# Patient Record
Sex: Male | Born: 1990 | Race: White | Hispanic: No | Marital: Single | Smoking: Never smoker
Health system: Southern US, Community
[De-identification: ages and names within clinical notes are randomized; demographics above are authoritative.]

## PROBLEM LIST (undated history)

## (undated) DIAGNOSIS — M419 Scoliosis, unspecified: Secondary | ICD-10-CM

## (undated) DIAGNOSIS — J45909 Unspecified asthma, uncomplicated: Secondary | ICD-10-CM

## (undated) HISTORY — PX: OTHER SURGICAL HISTORY: SHX169

## (undated) HISTORY — PX: ADENOIDECTOMY: SUR15

## (undated) HISTORY — PX: TONSILLECTOMY: SUR1361

---
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
## 2009-10-16 ENCOUNTER — Emergency Department (HOSPITAL_COMMUNITY): Admission: EM | Admit: 2009-10-16 | Discharge: 2009-10-16 | Payer: Self-pay | Admitting: Family Medicine

## 2010-10-10 NOTE — Op Note (Signed)
Trevor Miller, Trevor Miller                 ACCOUNT NO.:  [id number]   MEDICAL RECORD NO.:  [id number]          PATIENT TYPE:  AMB   LOCATION:  DSC                          FACILITY:  [facility]   PHYSICIAN:  Jefry H. Pollyann Kennedy, MD     DATE OF BIRTH:  05-16-1991   DATE OF PROCEDURE:  09/02/2004  DATE OF DISCHARGE:                                 OPERATIVE REPORT   PREOPERATIVE DIAGNOSIS:  Eustachian tube dysfunction.   POSTOPERATIVE DIAGNOSIS:  Eustachian tube dysfunction.   PROCEDURE:  Bilateral myringotomy with T-tube insertion.   SURGEON:  Jefry H. Pollyann Kennedy, MD.   ANESTHESIA:  Mask inhalation anesthesia was used.   COMPLICATIONS.:  None.   FINDINGS:  Severe bilateral tympanic membrane retraction with atelectasis  and adhesion to the promontory. There was thick mucoid middle ear effusion  on the right side.  Left side was without.   REFERRING PHYSICIAN:  Dr. Lysbeth Galas.   HISTORY:  20 year old with a lifelong history eustachian tube dysfunction,  status post multiple sets of ventilation tubes. Risks, benefits,  alternatives, complications of procedure were explained to the mother, who  seemed to understand and agreed to surgery.   PROCEDURE:  The patient was taken to the operating room, placed on the  operating table in supine position. Following induction of mask inhalation  anesthesia with intravenous sedation, the ears were examined using operating  microscope. Anterior-inferior myringotomy incisions were created in the  tubotympanic recess which was  really the only aerated and only middle ear space on both sides. Thick  effusion was aspirated from the right side. T tubes were placed without  difficulty. Ciprodex was dropped into the ear canals and cotton balls were  placed bilaterally. The patient was awakened, transferred to recovery in  stable condition.      JHR/MEDQ  D:  09/02/2004  T:  09/02/2004  Job:  [id number]   cc:   Delaney Meigs, M.D.  [address]  [place]  [place]  [address]  Fax: [phone]

## 2012-04-15 ENCOUNTER — Emergency Department (HOSPITAL_COMMUNITY): Payer: Self-pay

## 2012-04-15 ENCOUNTER — Encounter (HOSPITAL_COMMUNITY): Payer: Self-pay | Admitting: *Deleted

## 2012-04-15 ENCOUNTER — Emergency Department (HOSPITAL_COMMUNITY)
Admission: EM | Admit: 2012-04-15 | Discharge: 2012-04-15 | Disposition: A | Discharge status: Home / Self Care | Payer: Self-pay | Attending: Emergency Medicine | Admitting: Emergency Medicine

## 2012-04-15 DIAGNOSIS — R059 Cough, unspecified: Secondary | ICD-10-CM | POA: Insufficient documentation

## 2012-04-15 DIAGNOSIS — B338 Other specified viral diseases: Secondary | ICD-10-CM | POA: Insufficient documentation

## 2012-04-15 DIAGNOSIS — M4120 Other idiopathic scoliosis, site unspecified: Secondary | ICD-10-CM | POA: Insufficient documentation

## 2012-04-15 DIAGNOSIS — J3489 Other specified disorders of nose and nasal sinuses: Secondary | ICD-10-CM | POA: Insufficient documentation

## 2012-04-15 DIAGNOSIS — B349 Viral infection, unspecified: Secondary | ICD-10-CM

## 2012-04-15 DIAGNOSIS — R05 Cough: Secondary | ICD-10-CM | POA: Insufficient documentation

## 2012-04-15 DIAGNOSIS — R197 Diarrhea, unspecified: Secondary | ICD-10-CM | POA: Insufficient documentation

## 2012-04-15 HISTORY — DX: Scoliosis, unspecified: M41.9

## 2012-04-15 LAB — COMPREHENSIVE METABOLIC PANEL
ALT: 16 U/L (ref 0–53)
Alkaline Phosphatase: 71 U/L (ref 39–117)
CO2: 22 mEq/L (ref 19–32)
Chloride: 100 mEq/L (ref 96–112)
GFR calc Af Amer: >90 mL/min (ref >90)
GFR calc non Af Amer: >90 mL/min (ref >90)
Glucose, Bld: 134 mg/dL — ABNORMAL HIGH (ref 70–99)
Potassium: 3.1 mEq/L — ABNORMAL LOW (ref 3.5–5.1)
Sodium: 138 mEq/L (ref 135–145)
Total Bilirubin: 0.4 mg/dL (ref 0.3–1.2)
Total Protein: 8.4 g/dL — ABNORMAL HIGH (ref 6.0–8.3)

## 2012-04-15 LAB — CBC WITH DIFFERENTIAL/PLATELET
Hemoglobin: 17.3 g/dL — ABNORMAL HIGH (ref 13.0–17.0)
Lymphocytes Relative: 25 % (ref 12–46)
Lymphs Abs: 3.5 10*3/uL (ref 0.7–4.0)
Monocytes Relative: 11 % (ref 3–12)
Neutro Abs: 9.2 10*3/uL — ABNORMAL HIGH (ref 1.7–7.7)
Neutrophils Relative %: 65 % (ref 43–77)
Platelets: 280 10*3/uL (ref 150–400)
RBC: 5.44 MIL/uL (ref 4.22–5.81)
WBC: 14.3 10*3/uL — ABNORMAL HIGH (ref 4.0–10.5)

## 2012-04-15 LAB — URINALYSIS, ROUTINE W REFLEX MICROSCOPIC
Bilirubin Urine: NEGATIVE
Ketones, ur: NEGATIVE mg/dL
Nitrite: NEGATIVE
Protein, ur: NEGATIVE mg/dL
pH: 5.5 (ref 5.0–8.0)

## 2012-04-15 LAB — RAPID URINE DRUG SCREEN, HOSP PERFORMED
Barbiturates: NOT DETECTED
Benzodiazepines: POSITIVE — AB

## 2012-04-15 NOTE — ED Notes (Signed)
Pt states he quit cold [place] 3 weeks ago, but heard that was dangerous so he took half a tablet today. States it was oval,blue, and he was unsure of the mg.

## 2012-04-15 NOTE — ED Notes (Signed)
Pt states has been taking xanax for approx 3 years. Pt now states that he stopped taking it 3 weeks ago, COmplains of shakiness and jitteriness.

## 2012-04-15 NOTE — ED Notes (Signed)
Explained to pt we are waiting for Dr Deretha Emory to reassess pt.

## 2012-04-15 NOTE — ED Notes (Signed)
Patient transported to X-ray . Trevor Miller 

## 2012-04-15 NOTE — ED Notes (Signed)
Pt has been taking xanax for 3 years, stopped app 3 weeks ago, feels shakey and disoriented.,numb sensation

## 2012-04-15 NOTE — ED Provider Notes (Signed)
History   This chart was scribed for Trevor Jakes, MD by Trevor Miller, ED Scribe. This patient was seen in room APA14/APA14 and the patient's care was started at 8:33 PM    CSN: [id number]  Arrival date & time 04/15/12  1906   First MD Initiated Contact with Patient 04/15/12 1959      Chief Complaint  Patient presents with  . V70.1     The history is provided by the patient. No language interpreter was used.   Trevor Miller is a 21 y.o. male who presents to the Emergency Department complaining of 2-3 days of rhinorrhea with associated sore throat, phlegm-producing cough, fever, and 12-15 episodes of non-bloody diarrhea.  Pt also reports mild wheezing but states it is normal to asthma.  Pt denies dysuria, rash, HA, chest pain, dyspnea, abdominal pain, nausea, and emesis.  Pt reports sick contact 3 days ago. Pt further reports mild dizziness and "not feeling right" over past 2-3 days that he attributes to withdrawal from Xanax stopped 3 weeks ago.  Pt had been self-medicating for 3 years.   Pt has no h/o chronic medical conditions.  Pt denies tobacco use but reports alcohol use.   Past Medical History  Diagnosis Date  . Scoliosis     Past Surgical History  Procedure Date  . Tubes in ears   . Tonsillectomy   . Adenoidectomy     History reviewed. No pertinent family history.  History  Substance Use Topics  . Smoking status: Never Smoker   . Smokeless tobacco: Not on file  . Alcohol Use: Yes      Review of Systems  Constitutional: Negative for fever.  HENT: Positive for sore throat and rhinorrhea.   Respiratory: Positive for cough. Negative for shortness of breath.   Cardiovascular: Negative for chest pain.  Gastrointestinal: Positive for diarrhea. Negative for nausea, vomiting and abdominal pain.  Genitourinary: Negative for dysuria.  Musculoskeletal: Negative for back pain.  Skin: Negative for rash.  Neurological: Negative for headaches.    Allergies  Sulfa  antibiotics and Motrin  Home Medications  No current outpatient prescriptions on file.  BP 176/109  Pulse 62  Temp 98.2 F (36.8 C) (Oral)  Resp 18  Ht 5\' 7"  (1.702 m)  Wt 245 lb (111.131 kg)  BMI 38.37 kg/m2  SpO2 96%  Physical Exam  Nursing note and vitals reviewed. Constitutional: He is oriented to person, place, and time. He appears well-developed and well-nourished.  HENT:  Head: Normocephalic and atraumatic.  Mouth/Throat: Oropharynx is clear and moist.       Left PE tube in place, right PE tube in ear canal unsure if it is in place.  Bilateral TM normal.  Eyes: Conjunctivae normal and EOM are normal. Pupils are equal, round, and reactive to light.  Neck: Normal range of motion. No tracheal deviation present.  Cardiovascular: Normal rate, regular rhythm and normal heart sounds.   No murmur heard. Pulmonary/Chest: Effort normal and breath sounds normal. He has no wheezes.  Abdominal: Soft. Bowel sounds are normal. There is no tenderness.  Musculoskeletal: Normal range of motion. He exhibits no edema.  Lymphadenopathy:    He has no cervical adenopathy.  Neurological: He is alert and oriented to person, place, and time. No cranial nerve deficit. Coordination normal.  Skin: Skin is warm.  Psychiatric: He has a normal mood and affect.    ED Course  Procedures (including critical care time) DIAGNOSTIC STUDIES: Oxygen Saturation is 96% on room  air, adequate by my interpretation.    COORDINATION OF CARE: 8:43 PM- Patient informed of clinical course, understands medical decision-making process, and agrees with plan.  Ordered CBC, c-met, urinalysis, drug screen panel, chest XR, and head CT w/o contrast.  Labs Reviewed  CBC WITH DIFFERENTIAL - Abnormal; Notable for the following:    WBC 14.3 (*)     Hemoglobin 17.3 (*)     MCHC 36.9 (*)     Neutro Abs 9.2 (*)     Monocytes Absolute 1.5 (*)     All other components within normal limits  COMPREHENSIVE METABOLIC PANEL -  Abnormal; Notable for the following:    Potassium 3.1 (*)     Glucose, Bld 134 (*)     Total Protein 8.4 (*)     All other components within normal limits  URINALYSIS, ROUTINE W REFLEX MICROSCOPIC - Abnormal; Notable for the following:    Specific Gravity, Urine >1.030 (*)     All other components within normal limits  URINE RAPID DRUG SCREEN (HOSP PERFORMED) - Abnormal; Notable for the following:    Opiates POSITIVE (*)     Benzodiazepines POSITIVE (*)     Tetrahydrocannabinol POSITIVE (*)     All other components within normal limits   Results for orders placed during the hospital encounter of 04/15/12  CBC WITH DIFFERENTIAL      Component Value Range   WBC 14.3 (*) 4.0 - 10.5 K/uL   RBC 5.44  4.22 - 5.81 MIL/uL   Hemoglobin 17.3 (*) 13.0 - 17.0 g/dL   HCT 16.1  09.6 - 04.5 %   MCV 86.2  78.0 - 100.0 fL   MCH 31.8  26.0 - 34.0 pg   MCHC 36.9 (*) 30.0 - 36.0 g/dL   RDW 40.9  81.1 - 91.4 %   Platelets 280  150 - 400 K/uL   Neutrophils Relative 65  43 - 77 %   Neutro Abs 9.2 (*) 1.7 - 7.7 K/uL   Lymphocytes Relative 25  12 - 46 %   Lymphs Abs 3.5  0.7 - 4.0 K/uL   Monocytes Relative 11  3 - 12 %   Monocytes Absolute 1.5 (*) 0.1 - 1.0 K/uL   Eosinophils Relative 0  0 - 5 %   Eosinophils Absolute 0.0  0.0 - 0.7 K/uL   Basophils Relative 0  0 - 1 %   Basophils Absolute 0.0  0.0 - 0.1 K/uL  COMPREHENSIVE METABOLIC PANEL      Component Value Range   Sodium 138  135 - 145 mEq/L   Potassium 3.1 (*) 3.5 - 5.1 mEq/L   Chloride 100  96 - 112 mEq/L   CO2 22  19 - 32 mEq/L   Glucose, Bld 134 (*) 70 - 99 mg/dL   BUN 11  6 - 23 mg/dL   Creatinine, Ser 7.82  0.50 - 1.35 mg/dL   Calcium 95.6  8.4 - 21.3 mg/dL   Total Protein 8.4 (*) 6.0 - 8.3 g/dL   Albumin 4.9  3.5 - 5.2 g/dL   AST 15  0 - 37 U/L   ALT 16  0 - 53 U/L   Alkaline Phosphatase 71  39 - 117 U/L   Total Bilirubin 0.4  0.3 - 1.2 mg/dL   GFR calc non Af Amer >90  >90 mL/min   GFR calc Af Amer >90  >90 mL/min    URINALYSIS, ROUTINE W REFLEX MICROSCOPIC      Component Value  Range   Color, Urine YELLOW  YELLOW   APPearance CLEAR  CLEAR   Specific Gravity, Urine >1.030 (*) 1.005 - 1.030   pH 5.5  5.0 - 8.0   Glucose, UA NEGATIVE  NEGATIVE mg/dL   Hgb urine dipstick NEGATIVE  NEGATIVE   Bilirubin Urine NEGATIVE  NEGATIVE   Ketones, ur NEGATIVE  NEGATIVE mg/dL   Protein, ur NEGATIVE  NEGATIVE mg/dL   Urobilinogen, UA 0.2  0.0 - 1.0 mg/dL   Nitrite NEGATIVE  NEGATIVE   Leukocytes, UA NEGATIVE  NEGATIVE  URINE RAPID DRUG SCREEN (HOSP PERFORMED)      Component Value Range   Opiates POSITIVE (*) NONE DETECTED   Cocaine NONE DETECTED  NONE DETECTED   Benzodiazepines POSITIVE (*) NONE DETECTED   Amphetamines NONE DETECTED  NONE DETECTED   Tetrahydrocannabinol POSITIVE (*) NONE DETECTED   Barbiturates NONE DETECTED  NONE DETECTED    Dg Chest 2 View  04/15/2012  *RADIOLOGY REPORT*  Clinical Data: Cough and wheezing for 4 days with history of asthma.  Smoker.  CHEST - 2 VIEW  Comparison: None.  Findings: Midline trachea.  Normal heart size and mediastinal contours. No pleural effusion or pneumothorax.  Clear lungs.  IMPRESSION: Normal chest.   Original Report Authenticated By: Jeronimo Greaves, M.D.    Ct Head Wo Contrast  04/15/2012  *RADIOLOGY REPORT*  Clinical Data: Numbness, disorientation.  CT HEAD WITHOUT CONTRAST  Technique:  Contiguous axial images were obtained from the base of the skull through the vertex without contrast.  Comparison: None.  Findings: Partial opacification of right mastoid air cells. There is no evidence of acute intracranial hemorrhage, brain edema, mass lesion, acute infarction,   mass effect, or midline shift. Acute infarct may be inapparent on noncontrast CT.  No other intra-axial abnormalities are seen, and the ventricles and sulci are within normal limits in size and symmetry.   No abnormal extra-axial fluid collections or masses are identified.  No significant calvarial  abnormality.  IMPRESSION: 1. Negative for bleed or other acute intracranial process.   Original Report Authenticated By: D. Andria Rhein, MD      1. Viral illness       MDM   Workup in the emergency part without any specific findings. Chest x-rays negative for pneumonia head CT without any acute findings. There is an elevated white blood cell count suggestive leukocytosis which may represent viral illness. Patient did relate that he had exposure to a child that had the upper respiratory infection and perhaps a diarrhea illness both symptoms he has had. Despite patient saying he has any benzodiazepines for 3 weeks. Urine drug screen was positive for benzos and opiates. Suggesting the patient is still utilizing un prescribed medications.     I personally performed the services described in this documentation, which was scribed in my presence. The recorded information has been reviewed and is accurate.          Trevor Jakes, MD 04/15/12 2330

## 2018-03-13 ENCOUNTER — Emergency Department (HOSPITAL_COMMUNITY)
Admission: EM | Admit: 2018-03-13 | Discharge: 2018-03-13 | Disposition: A | Discharge status: Home / Self Care | Payer: Self-pay | Attending: Emergency Medicine | Admitting: Emergency Medicine

## 2018-03-13 ENCOUNTER — Encounter (HOSPITAL_COMMUNITY): Payer: Self-pay | Admitting: Emergency Medicine

## 2018-03-13 ENCOUNTER — Other Ambulatory Visit: Payer: Self-pay

## 2018-03-13 DIAGNOSIS — L0882 Omphalitis not of newborn: Secondary | ICD-10-CM | POA: Insufficient documentation

## 2018-03-13 MED ORDER — AMOXICILLIN-POT CLAVULANATE 875-125 MG PO TABS: 1.0000 | ORAL_TABLET | Freq: Two times a day (BID) | ORAL | 0 refills | Status: AC

## 2018-03-13 NOTE — Discharge Instructions (Addendum)
The condition you are having, omphalitis, will improve with warm compresses, and washing the umbilical area well with soap and water 2-3 times a day. We are also prescribing an antibiotic to prevent the infection from worsening. Try to keep the area clean at all times to improve healing and prevent reoccurrence.

## 2018-03-13 NOTE — ED Provider Notes (Signed)
[facility] EMERGENCY DEPARTMENT Provider Note   CSN: [id number] Arrival date & time: 03/13/18  1240     History   Chief Complaint Chief Complaint  Patient presents with  . Wound Check    HPI Trevor Miller is a 27 y.o. male.  HPI   He complains of recurrent infection in the umbilical area, most recently for the last several days associated with pain, and drainage.  He does like it is aggravated because he has to lift things at work.  He denies fever, chills, nausea, vomiting, cough, shortness of breath, chest pain, weakness or dizziness.  There are no other known modifying factors.   Past Medical History:  Diagnosis Date  . Scoliosis     There are no active problems to display for this patient.   Past Surgical History:  Procedure Laterality Date  . ADENOIDECTOMY    . TONSILLECTOMY    . tubes in ears          Home Medications    Prior to Admission medications   Medication Sig Start Date End Date Taking? Authorizing Provider  amoxicillin-clavulanate (AUGMENTIN) 875-125 MG tablet Take 1 tablet by mouth 2 (two) times daily. One po bid x 7 days 03/13/18   Mancel Bale, MD    Family History History reviewed. No pertinent family history.  Social History Social History   Tobacco Use  . Smoking status: Never Smoker  . Smokeless tobacco: Never Used  Substance Use Topics  . Alcohol use: Never    Frequency: Never  . Drug use: Yes    Types: Marijuana     Allergies   Sulfa antibiotics and Motrin [ibuprofen]   Review of Systems Review of Systems  All other systems reviewed and are negative.    Physical Exam Updated Vital Signs BP (!) 142/86   Pulse 64   Temp 98 F (36.7 C) (Oral)   Resp 18   Ht 5\' 7"  (1.702 m)   Wt 127 kg   SpO2 99%   BMI 43.85 kg/m   Physical Exam  Constitutional: He is oriented to person, place, and time. He appears well-developed and well-nourished.  HENT:  Head: Normocephalic and atraumatic.  Right Ear: External ear  normal.  Left Ear: External ear normal.  Eyes: Pupils are equal, round, and reactive to light. Conjunctivae and EOM are normal.  Neck: Normal range of motion and phonation normal. Neck supple.  Cardiovascular: Normal rate.  Pulmonary/Chest: Effort normal. He exhibits no bony tenderness.  Abdominal: Soft.  Mild periumbilical tenderness.  Minimal erythema and some drainage at the depth of the umbilical depression.  No associated fluctuance.  No associated abdominal pain.  Musculoskeletal: Normal range of motion.  Neurological: He is alert and oriented to person, place, and time. No cranial nerve deficit or sensory deficit. He exhibits normal muscle tone. Coordination normal.  Skin: Skin is warm, dry and intact.  Psychiatric: He has a normal mood and affect. His behavior is normal. Judgment and thought content normal.  Nursing note and vitals reviewed.    ED Treatments / Results  Labs (all labs ordered are listed, but only abnormal results are displayed) Labs Reviewed - No data to display  EKG None  Radiology No results found.  Procedures Procedures (including critical care time)  Medications Ordered in ED Medications - No data to display   Initial Impression / Assessment and Plan / ED Course  I have reviewed the triage vital signs and the nursing notes.  Pertinent labs &  imaging results that were available during my care of the patient were reviewed by me and considered in my medical decision making (see chart for details).      Patient Vitals for the past 24 hrs:  BP Temp Temp src Pulse Resp SpO2 Height Weight  03/13/18 1603 - - - 64 - 99 % - -  03/13/18 1602 (!) 142/86 - - - - - - -  03/13/18 1309 (!) 163/95 98 F (36.7 C) Oral 73 18 99 % 5\' 7"  (1.702 m) 127 kg    4:11 PM Reevaluation with update and discussion. After initial assessment and treatment, an updated evaluation reveals no change in clinical status.  Findings discussed with the patient and all questions  were answered. 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  No indication for further ED treatment or hospitalization at this time  CRITICAL CARE-no Performed by: Mancel Bale  Nursing Notes Reviewed/ Care Coordinated Applicable Imaging Reviewed Interpretation of Laboratory Data incorporated into ED treatment  The patient appears reasonably screened and/or stabilized for discharge and I doubt any other medical condition or other [facility] requiring further screening, evaluation, or treatment in the ED at this time prior to discharge.  Plan: Home Medications-OTC analgesia as needed; Home Treatments-warm compresses to affected area; return here if the recommended treatment, does not improve the symptoms; Recommended follow up-PCP of choice as needed  Final Clinical Impressions(s) / ED Diagnoses   Final diagnoses:  Omphalitis    ED Discharge Orders         Ordered    amoxicillin-clavulanate (AUGMENTIN) 875-125 MG tablet  2 times daily     03/13/18 1555           Mancel Bale, MD 03/13/18 1612

## 2018-03-13 NOTE — ED Triage Notes (Signed)
Patient c/o pain in navel that started after lifting something at work on Monday or Tuesday. Per patient serosanguinous drainage and pain. Denies any nausea, vomiting, diarrhea, or vomiting. Per patient had area of skin in navel cut and removed 7-8 years ago at an urgent care but was never told what it was and has no had any problems.

## 2018-03-13 NOTE — ED Notes (Signed)
Instructed pt to take all of antibiotics as prescribed. 

## 2019-04-25 ENCOUNTER — Other Ambulatory Visit: Payer: Self-pay

## 2019-04-25 DIAGNOSIS — Z20822 Contact with and (suspected) exposure to covid-19: Secondary | ICD-10-CM

## 2019-04-28 ENCOUNTER — Telehealth: Payer: Self-pay | Admitting: *Deleted

## 2019-04-28 LAB — NOVEL CORONAVIRUS, NAA: SARS-CoV-2, NAA: NOT DETECTED

## 2019-04-28 NOTE — Telephone Encounter (Signed)
Patient called ,given negative covid results . 

## 2020-05-21 ENCOUNTER — Other Ambulatory Visit: Payer: Self-pay

## 2020-05-21 ENCOUNTER — Encounter (HOSPITAL_COMMUNITY): Payer: Self-pay | Admitting: *Deleted

## 2020-05-21 ENCOUNTER — Emergency Department (HOSPITAL_COMMUNITY)
Admission: EM | Admit: 2020-05-21 | Discharge: 2020-05-21 | Disposition: A | Discharge status: Home / Self Care | Payer: HRSA Program | Attending: Emergency Medicine | Admitting: Emergency Medicine

## 2020-05-21 DIAGNOSIS — J45909 Unspecified asthma, uncomplicated: Secondary | ICD-10-CM | POA: Insufficient documentation

## 2020-05-21 DIAGNOSIS — U071 COVID-19: Secondary | ICD-10-CM | POA: Diagnosis not present

## 2020-05-21 DIAGNOSIS — R059 Cough, unspecified: Secondary | ICD-10-CM | POA: Diagnosis present

## 2020-05-21 HISTORY — DX: Unspecified asthma, uncomplicated: J45.909

## 2020-05-21 LAB — RESP PANEL BY RT-PCR (FLU A&B, COVID) ARPGX2
Influenza A by PCR: NEGATIVE
Influenza B by PCR: NEGATIVE
SARS Coronavirus 2 by RT PCR: POSITIVE — AB

## 2020-05-21 MED ORDER — PREDNISONE 50 MG PO TABS: ORAL_TABLET | ORAL | 0 refills | Status: AC

## 2020-05-21 MED ORDER — ALBUTEROL SULFATE HFA 108 (90 BASE) MCG/ACT IN AERS
2.0000 | INHALATION_SPRAY | Freq: Once | RESPIRATORY_TRACT | Status: AC
  Administered 2020-05-21: 2 via RESPIRATORY_TRACT
  Filled 2020-05-21: qty 6.7

## 2020-05-21 NOTE — Discharge Instructions (Addendum)
You will need to maintain your home quarantine until you are no longer running a fever.  Use the inhaler given - 2 puffs every 4 hours if needed for wheezing or shortness of breath.  You have also been prescribed some prednisone to help with your wheezing episodes.  The monoclonal antibody infusion clinic with [facility] will be contacting you for additional screening within the next 48 hours to determine if you are a candidate for this treatment.  Expect their call.  In the interim return here if you develop any worsening symptoms (shortness of breath or weakness).

## 2020-05-21 NOTE — ED Provider Notes (Signed)
[facility] EMERGENCY DEPARTMENT Provider Note   CSN: [id number] Arrival date & time: 05/21/20  4665     History Chief Complaint  Patient presents with  . Covid Exposure    Trevor Miller is a 29 y.o. male with a distant history of asthma, presenting for evaluation of suspected exposure to Covid 19 by a coworker.  He was exposed 6 days ago and started to develop a dry cough, generalized body aches and fever to 101.5 , today is day 5 of symptoms.  He denies sob but does endorse occasional wheezing episodes.  He denies any other symptoms at this time. He has taken tylenol for fever reduction. Additionally denies headache, sore throat, n/v/d, abd pain, chest pain.    HPI     Past Medical History:  Diagnosis Date  . Asthma   . Scoliosis     There are no problems to display for this patient.   Past Surgical History:  Procedure Laterality Date  . ADENOIDECTOMY    . TONSILLECTOMY    . tubes in ears         No family history on file.  Social History   Tobacco Use  . Smoking status: Never Smoker  . Smokeless tobacco: Never Used  Vaping Use  . Vaping Use: Never used  Substance Use Topics  . Alcohol use: Never  . Drug use: Not Currently    Types: Marijuana    Home Medications Prior to Admission medications   Medication Sig Start Date End Date Taking? Authorizing Provider  predniSONE (DELTASONE) 50 MG tablet Take one tablet daily for 5 days 05/21/20  Yes Trevor Miller, Trevor Fanning, PA-C  amoxicillin-clavulanate (AUGMENTIN) 875-125 MG tablet Take 1 tablet by mouth 2 (two) times daily. One po bid x 7 days 03/13/18   Trevor Bale, MD    Allergies    Sulfa antibiotics and Motrin [ibuprofen]  Review of Systems   Review of Systems  Constitutional: Positive for fever. Negative for chills.  HENT: Negative for congestion, ear pain, rhinorrhea, sinus pressure, sore throat, trouble swallowing and voice change.   Eyes: Negative for discharge.  Respiratory: Positive for cough, shortness  of breath and wheezing. Negative for stridor.   Cardiovascular: Negative for chest pain.  Gastrointestinal: Negative for abdominal pain, diarrhea, nausea and vomiting.  Genitourinary: Negative.   All other systems reviewed and are negative.   Physical Exam Updated Vital Signs BP (!) 145/88 (BP Location: Left Arm)   Pulse 67   Temp 99.6 F (37.6 C) (Oral)   Resp 18   Ht 5\' 7"  (1.702 m)   Wt 127 kg   SpO2 99%   BMI 43.85 kg/m   Physical Exam Vitals and nursing note reviewed.  Constitutional:      Appearance: Normal appearance. He is well-developed and well-nourished. He is obese.  HENT:     Head: Normocephalic and atraumatic.  Eyes:     Conjunctiva/sclera: Conjunctivae normal.  Cardiovascular:     Rate and Rhythm: Normal rate and regular rhythm.     Pulses: Intact distal pulses.     Heart sounds: Normal heart sounds.  Pulmonary:     Effort: Pulmonary effort is normal. No respiratory distress.     Breath sounds: Normal breath sounds. No wheezing, rhonchi or rales.  Musculoskeletal:        General: Normal range of motion.     Cervical back: Normal range of motion.  Skin:    General: Skin is warm and dry.  Neurological:  Mental Status: He is alert.  Psychiatric:        Mood and Affect: Mood and affect normal.     ED Results / Procedures / Treatments   Labs (all labs ordered are listed, but only abnormal results are displayed) Labs Reviewed  RESP PANEL BY RT-PCR (FLU A&B, COVID) ARPGX2 - Abnormal; Notable for the following components:      Result Value   SARS Coronavirus 2 by RT PCR POSITIVE (*)    All other components within normal limits    EKG None  Radiology No results found.  Procedures Procedures (including critical care time)  Medications Ordered in ED Medications  albuterol (VENTOLIN HFA) 108 (90 Base) MCG/ACT inhaler 2 puff (2 puffs Inhalation Given 05/21/20 1258)    ED Course  I have reviewed the triage vital signs and the nursing  notes.  Pertinent labs & imaging results that were available during my care of the patient were reviewed by me and considered in my medical decision making (see chart for details).    MDM Rules/Calculators/A&P                          Pt with Covid 19, day 5.  Risk factors include asthma and obesity.  He is not currently wheezing but reports hx of this sx.  He was given albuterol mdi for home use, prednisone pulse dosing.  No hypoxia here. No indication for admission.  Discussed MAB infusion which he is interested in obtaining.  Contacted this clinic who will contact pt within 48 hours to screen and arrange. Discussed home quarantine recommendations.  Work note provided.   Trevor Miller was evaluated in Emergency Department on 05/21/2020 for the symptoms described in the history of present illness. He was evaluated in the context of the global COVID-19 pandemic, which necessitated consideration that the patient might be at risk for infection with the SARS-CoV-2 virus that causes COVID-19. Institutional protocols and algorithms that pertain to the evaluation of patients at risk for COVID-19 are in a state of rapid change based on information released by regulatory bodies including the CDC and federal and state organizations. These policies and algorithms were followed during the patient's care in the ED.   Final Clinical Impression(s) / ED Diagnoses Final diagnoses:  COVID-19    Rx / DC Orders ED Discharge Orders         Ordered    predniSONE (DELTASONE) 50 MG tablet        05/21/20 1200           Trevor Amor, PA-C 05/21/20 1510    Trevor Savoy, MD 05/22/20 [phone]

## 2020-05-21 NOTE — ED Triage Notes (Signed)
covid exposure

## 2020-05-21 NOTE — ED Notes (Signed)
CRITICAL VALUE ALERT  Critical Value:  COVID POSITIVE  Date & Time Notied:  05/21/20 & 1103hrs  Provider Notified: EDP  Orders Received/Actions taken:  Notified

## 2021-04-15 ENCOUNTER — Encounter (HOSPITAL_COMMUNITY): Payer: Self-pay | Admitting: *Deleted

## 2021-04-15 ENCOUNTER — Other Ambulatory Visit: Payer: Self-pay

## 2021-04-15 ENCOUNTER — Emergency Department (HOSPITAL_COMMUNITY)
Admission: EM | Admit: 2021-04-15 | Discharge: 2021-04-16 | Disposition: A | Discharge status: Home / Self Care | Payer: Self-pay | Attending: Emergency Medicine | Admitting: Emergency Medicine

## 2021-04-15 DIAGNOSIS — R197 Diarrhea, unspecified: Secondary | ICD-10-CM | POA: Insufficient documentation

## 2021-04-15 DIAGNOSIS — J029 Acute pharyngitis, unspecified: Secondary | ICD-10-CM | POA: Insufficient documentation

## 2021-04-15 DIAGNOSIS — Z5321 Procedure and treatment not carried out due to patient leaving prior to being seen by health care provider: Secondary | ICD-10-CM | POA: Insufficient documentation

## 2021-04-15 DIAGNOSIS — R109 Unspecified abdominal pain: Secondary | ICD-10-CM | POA: Insufficient documentation

## 2021-04-15 DIAGNOSIS — R112 Nausea with vomiting, unspecified: Secondary | ICD-10-CM | POA: Insufficient documentation

## 2021-04-15 NOTE — ED Triage Notes (Signed)
Pt with abd pain, +N/V/D.  Chills, sore throat. X 1 week

## 2021-04-16 ENCOUNTER — Emergency Department (HOSPITAL_COMMUNITY)
Admission: EM | Admit: 2021-04-16 | Discharge: 2021-04-16 | Disposition: A | Discharge status: Home / Self Care | Payer: Self-pay | Attending: Emergency Medicine | Admitting: Emergency Medicine

## 2021-04-16 DIAGNOSIS — J111 Influenza due to unidentified influenza virus with other respiratory manifestations: Secondary | ICD-10-CM | POA: Insufficient documentation

## 2021-04-16 DIAGNOSIS — J45909 Unspecified asthma, uncomplicated: Secondary | ICD-10-CM | POA: Insufficient documentation

## 2021-04-16 NOTE — ED Provider Notes (Signed)
[facility] EMERGENCY DEPARTMENT Provider Note   CSN: [id number] Arrival date & time: 04/16/21  1547     History Chief Complaint  Patient presents with   Fever    Trevor Miller is a 30 y.o. male with a history of asthma presenting for evaluation of flulike symptoms. He developed symptoms 2 days a go which includes body aches, subjective fever and nonproductive cough.  Symptoms do not include shortness of breath, chest pain,  Nausea, vomiting or diarrhea.  He is here with family members with similar symptoms, 2 of his children have tested positive for influenza. The patient last took advil last night with transient improvement in his fever.  He does endorse feeling better than yesterday after getting a good nights sleep last night.     The history is provided by the patient.      Past Medical History:  Diagnosis Date   Asthma    Scoliosis     There are no problems to display for this patient.   Past Surgical History:  Procedure Laterality Date   ADENOIDECTOMY     TONSILLECTOMY     tubes in ears         No family history on file.  Social History   Tobacco Use   Smoking status: Never   Smokeless tobacco: Never  Vaping Use   Vaping Use: Never used  Substance Use Topics   Alcohol use: Never   Drug use: Not Currently    Types: Marijuana    Home Medications Prior to Admission medications   Medication Sig Start Date End Date Taking? Authorizing Provider  amoxicillin-clavulanate (AUGMENTIN) 875-125 MG tablet Take 1 tablet by mouth 2 (two) times daily. One po bid x 7 days 03/13/18   Mancel Bale, MD  predniSONE (DELTASONE) 50 MG tablet Take one tablet daily for 5 days 05/21/20   Burgess Amor, PA-C    Allergies    Sulfa antibiotics and Motrin [ibuprofen]  Review of Systems   Review of Systems  Constitutional:  Positive for chills and fever.  HENT:  Positive for congestion and rhinorrhea. Negative for ear pain, sinus pressure, sore throat, trouble swallowing  and voice change.   Eyes:  Negative for discharge.  Respiratory:  Positive for cough. Negative for shortness of breath, wheezing and stridor.   Cardiovascular:  Negative for chest pain.  Gastrointestinal:  Negative for abdominal pain, nausea and vomiting.  Genitourinary: Negative.   Musculoskeletal:  Positive for myalgias. Negative for neck pain and neck stiffness.  Neurological:  Negative for headaches.  All other systems reviewed and are negative.  Physical Exam Updated Vital Signs BP 130/86   Pulse 91   Temp 98.2 F (36.8 C) (Oral)   Ht 5\' 10"  (1.778 m)   Wt 122.5 kg   SpO2 93%   BMI 38.74 kg/m   Physical Exam Vitals and nursing note reviewed.  Constitutional:      Appearance: He is well-developed.  HENT:     Head: Normocephalic and atraumatic.  Eyes:     Conjunctiva/sclera: Conjunctivae normal.  Cardiovascular:     Rate and Rhythm: Normal rate and regular rhythm.     Heart sounds: Normal heart sounds.  Pulmonary:     Effort: Pulmonary effort is normal. No respiratory distress.     Breath sounds: Normal breath sounds. No wheezing or rhonchi.  Musculoskeletal:        General: Normal range of motion.     Cervical back: Normal range of motion.  Skin:  General: Skin is warm and dry.  Neurological:     Mental Status: He is alert.    ED Results / Procedures / Treatments   Labs (all labs ordered are listed, but only abnormal results are displayed) Labs Reviewed - No data to display  EKG None  Radiology No results found.  Procedures Procedures   Medications Ordered in ED Medications - No data to display  ED Course  I have reviewed the triage vital signs and the nursing notes.  Pertinent labs & imaging results that were available during my care of the patient were reviewed by me and considered in my medical decision making (see chart for details).    MDM Rules/Calculators/A&P                           Patient presenting with flulike symptoms who has  multiple family members currently testing positive for influenza A.  We discussed the role of Tamiflu, patient defers this treatment at this time.  He had no respiratory distress on exam, his lungs are clear, he does not appear dehydrated.  He was encouraged home treatments including continued Advil as needed for myalgias or fever.  Rest, increase fluids,.  Follow-up anticipated.  He was given a work note per patient request. Final Clinical Impression(s) / ED Diagnoses Final diagnoses:  Influenza-like illness    Rx / DC Orders ED Discharge Orders     None        Victoriano Lain 04/16/21 1821    Jacalyn Lefevre, MD 04/16/21 2320

## 2021-04-16 NOTE — Discharge Instructions (Signed)
You have been diagnosed with the flu today.  Rest make sure you are drinking plenty of fluids.  May continue using Advil if needed for body aches and fever.  Get rechecked for any worsening symptoms.  You should remain out of work until Monday, at which time you may return if you are fever free and feeling improved.

## 2021-04-16 NOTE — ED Triage Notes (Signed)
Fever, chills, poor intake, body aches, cough x2 days, seen yesterday, not improved. No daily medication. Advil yesterday without relief
